# Patient Record
Sex: Male | Born: 1995 | Race: White | Hispanic: No | Marital: Single | Smoking: Never smoker
Health system: Southern US, Community
[De-identification: ages and names within clinical notes are randomized; demographics above are authoritative.]

---
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
## 2013-05-25 ENCOUNTER — Ambulatory Visit (HOSPITAL_BASED_OUTPATIENT_CLINIC_OR_DEPARTMENT_OTHER)
Admission: RE | Admit: 2013-05-25 | Discharge: 2013-05-25 | Disposition: A | Discharge status: Home / Self Care | Payer: BC Managed Care – PPO | Source: Ambulatory Visit | Attending: Family Medicine | Admitting: Family Medicine

## 2013-05-25 ENCOUNTER — Other Ambulatory Visit: Payer: Self-pay | Admitting: Family Medicine

## 2013-05-25 ENCOUNTER — Encounter: Payer: Self-pay | Admitting: Family Medicine

## 2013-05-25 ENCOUNTER — Ambulatory Visit (INDEPENDENT_AMBULATORY_CARE_PROVIDER_SITE_OTHER): Payer: BC Managed Care – PPO | Admitting: Family Medicine

## 2013-05-25 VITALS — BP 104/66 | HR 70 | Temp 99.0°F | Resp 18 | Ht 71.5 in | Wt 154.0 lb

## 2013-05-25 DIAGNOSIS — N50811 Right testicular pain: Secondary | ICD-10-CM

## 2013-05-25 DIAGNOSIS — N509 Disorder of male genital organs, unspecified: Secondary | ICD-10-CM

## 2013-05-25 DIAGNOSIS — N508 Other specified disorders of male genital organs: Secondary | ICD-10-CM | POA: Insufficient documentation

## 2013-05-25 NOTE — Progress Notes (Signed)
Office Note 05/25/2013  CC:  Chief Complaint  Patient presents with  . Establish Care  . Testicle Pain    right sided    HPI:  Luis FriesJeffrey Van Eyk is a 18 y.o. White male who is here to establish care. Patient's most recent primary MD: none Old records were not reviewed prior to or during today's visit.  Discomfort noted in right testicle for approx the last 1 yr.  Minor ache or "squished" feeling. Noting it more frequently lately (x 1 mo--a few times per week at least), used to be just once in a while.   Some adjustment in boxers used to alleviate things ok but not anymore.  He cannot feel or see any bump or anything that is unusual in the area.  Left side feels completely normal. No distinct injury preceding the pain and he does not do any activities that have recurrent trauma to GU region such as biking, horseback riding, or riding tractor.    History reviewed. No pertinent past medical history. Pt reports being appropriately vaccinated--has vaccine records at home.  Past Surgical History  Procedure Laterality Date  . Wisdom tooth extraction  2013    Family History  Problem Relation Age of Onset  . Diabetes Maternal Grandmother     History   Social History  . Marital Status: Single    Spouse Name: N/A    Number of Children: N/A  . Years of Education: N/A   Occupational History  . Not on file.   Social History Main Topics  . Smoking status: Never Smoker   . Smokeless tobacco: Never Used  . Alcohol Use: No  . Drug Use: No  . Sexual Activity: Not on file   Other Topics Concern  . Not on file   Social History Narrative   Single, no children.  Two brothers, three sisters.   [place] from [place].   Tax adviserMechanical engineering internship with [employer]   Currently attending [employer].   No T/A/Ds.         MEDS: none  No Known Allergies  ROS Review of Systems  Constitutional: Negative for fever and fatigue.  HENT: Negative for  congestion and sore throat.   Eyes: Negative for visual disturbance.  Respiratory: Negative for cough.   Cardiovascular: Negative for chest pain.  Gastrointestinal: Negative for nausea and abdominal pain.  Genitourinary: Positive for testicular pain (as per HPI). Negative for dysuria.  Musculoskeletal: Negative for back pain and joint swelling.  Skin: Negative for rash.  Neurological: Negative for weakness and headaches.  Hematological: Negative for adenopathy.  Psychiatric/Behavioral: Negative for dysphoric mood.    PE; Blood pressure 104/66, pulse 70, temperature 99 F (37.2 C), temperature source Temporal, resp. rate 18, height 5' 11.5" (1.816 m), weight 154 lb (69.854 kg), SpO2 98.00%. Gen: Alert, well appearing.  Patient is oriented to person, place, time, and situation. ZOX:WRUEENT:Eyes: no injection, icteris, swelling, or exudate.  EOMI, PERRLA. Mouth: lips without lesion/swelling.  Oral mucosa pink and moist. Oropharynx without erythema, exudate, or swelling.  Neck - No masses or thyromegaly or limitation in range of motion CV: RRR, no m/r/g.   LUNGS: CTA bilat, nonlabored resps, good aeration in all lung fields. ABD: soft, NT, ND, BS normal.  No hepatospenomegaly or mass.  No bruits. EXT: no clubbing, cyanosis, or edema.  Genitals normal; both testes normal but with very mild R>L tenderness over superior-most aspect of epididymus.  No masses, hydroceles, varicoceles, erythema or swelling.  Shaft normal, circumcised, meatus normal without discharge. No inguinal hernia noted. No inguinal lymphadenopathy.  Pertinent labs:  None today  ASSESSMENT AND PLAN:   New pt; no old records to obtain.  Testicular pain, right I think he is a bit more sensitive to touch on superior aspect of epididymus on right compared to left.  However, I don't feel any mass/nodule. Check scrotal u/s since patient in high risk age category for testicular cancer.  An After Visit Summary was printed and given  to the patient.  Return if symptoms worsen or fail to improve.

## 2013-05-25 NOTE — Assessment & Plan Note (Signed)
I think he is a bit more sensitive to touch on superior aspect of epididymus on right compared to left.  However, I don't feel any mass/nodule. Check scrotal u/s since patient in high risk age category for testicular cancer.

## 2013-05-25 NOTE — Progress Notes (Signed)
Pre visit review using our clinic review tool, if applicable. No additional management support is needed unless otherwise documented below in the visit note. 

## 2015-01-04 IMAGING — US US ART/VEN ABD/PELV/SCROTUM DOPPLER LTD
1 series · 14 of 20 positions shown · non-contrast
Comparison: None.

CLINICAL DATA: Right scrotal region pain for 1 year

EXAM:
SCROTAL ULTRASOUND
DOPPLER ULTRASOUND OF THE TESTICLES
TECHNIQUE: Complete ultrasound examination of the testicles, epididymis, and
other scrotal structures was performed. Color and spectral Doppler
ultrasound were also utilized to evaluate blood flow to the
testicles.

[Series 1: us art/ven abd/pelv/scrotum doppler ltd · 0.08mm/px · 14 of 20 slices shown]
[im 1/20]
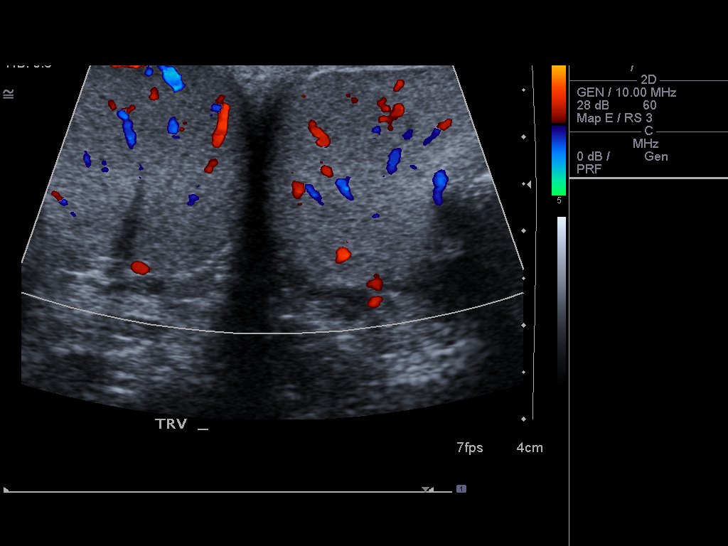
[im 3/20]
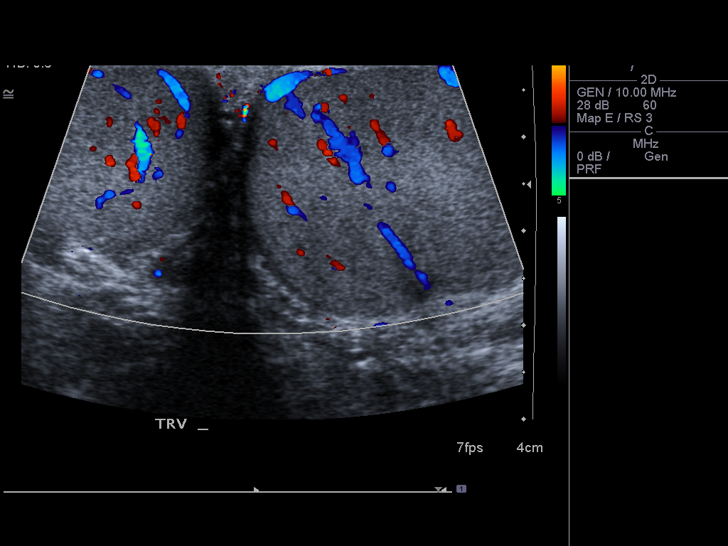
[im 4/20]
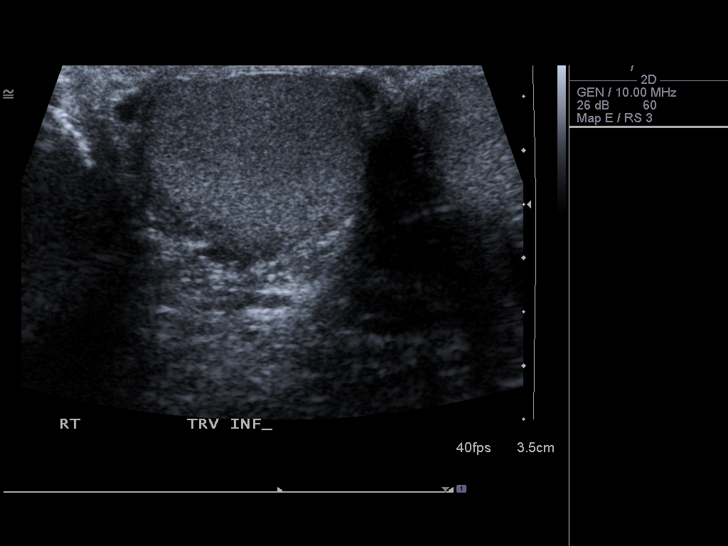
[im 6/20]
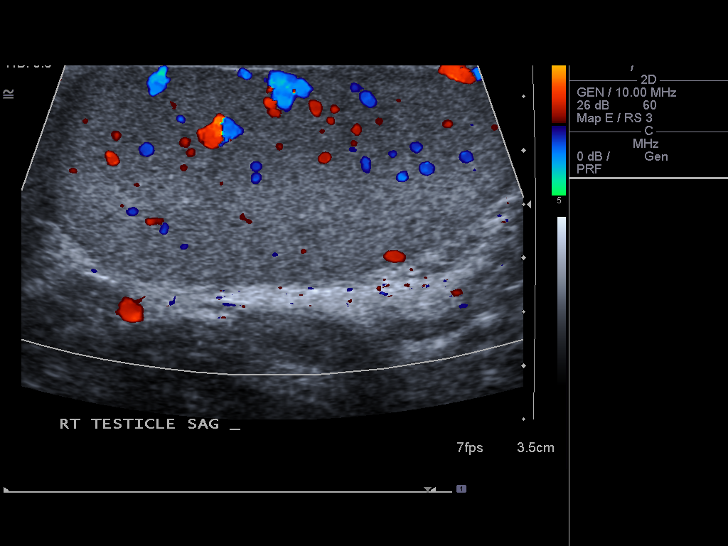
[im 7/20]
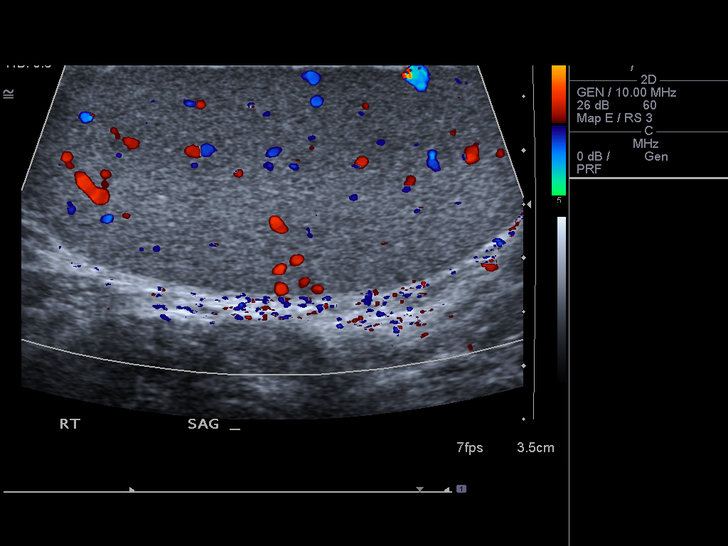
[im 8/20]
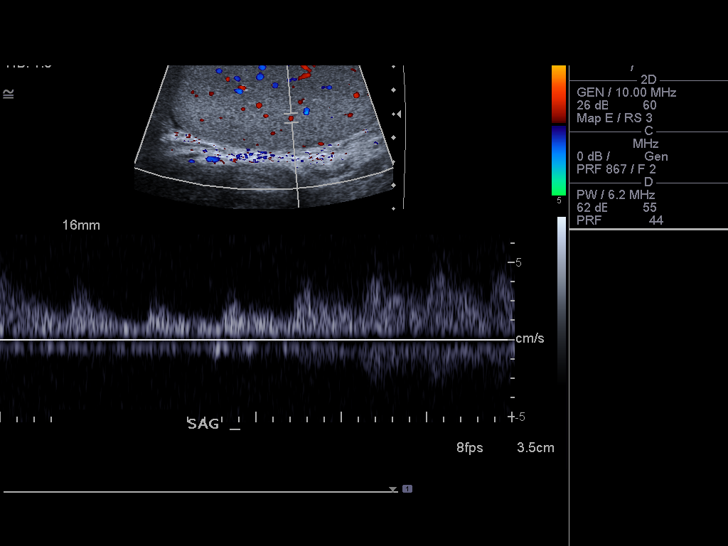
[im 10/20]
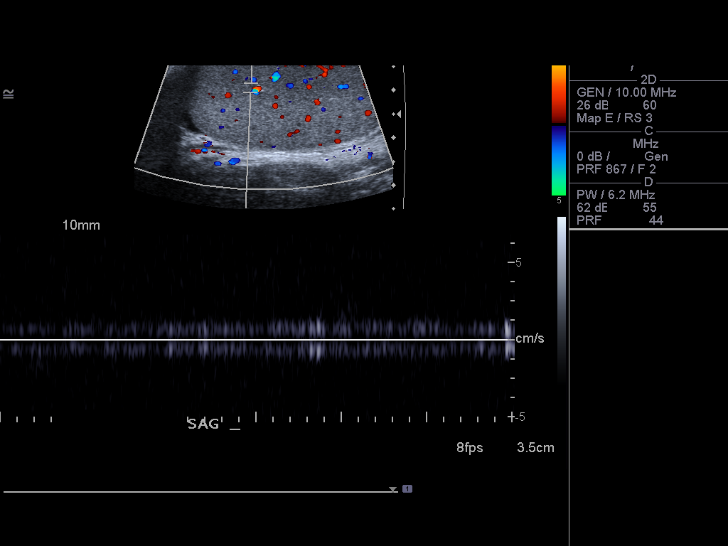
[im 11/20]
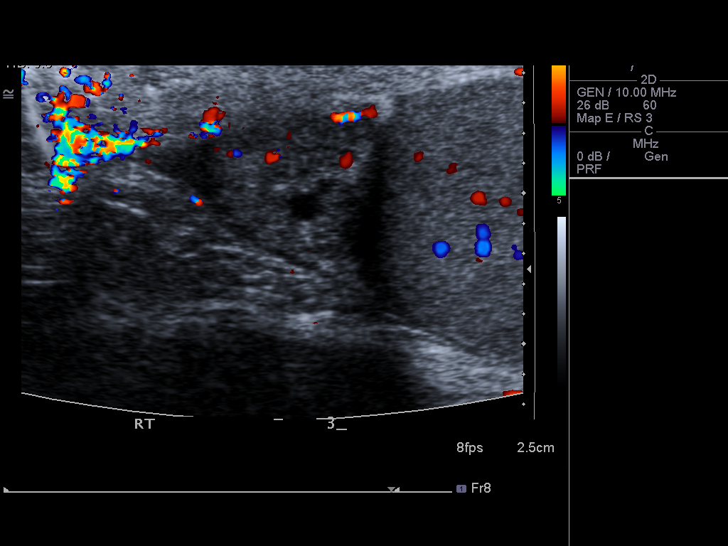
[im 13/20]
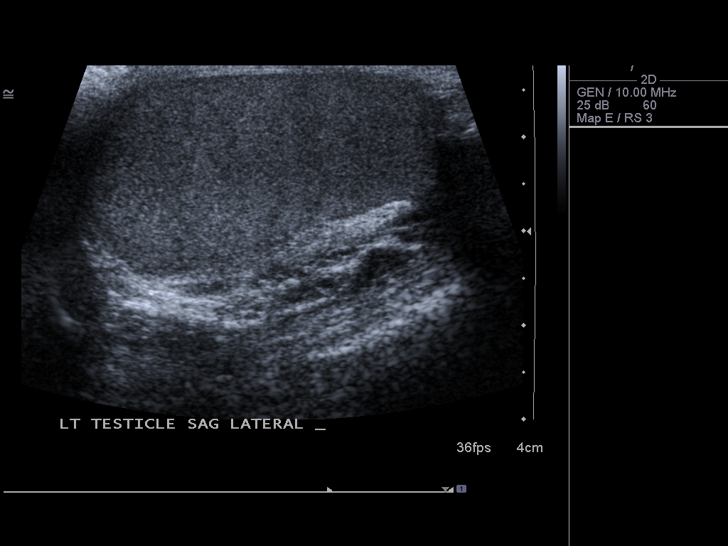
[im 14/20]
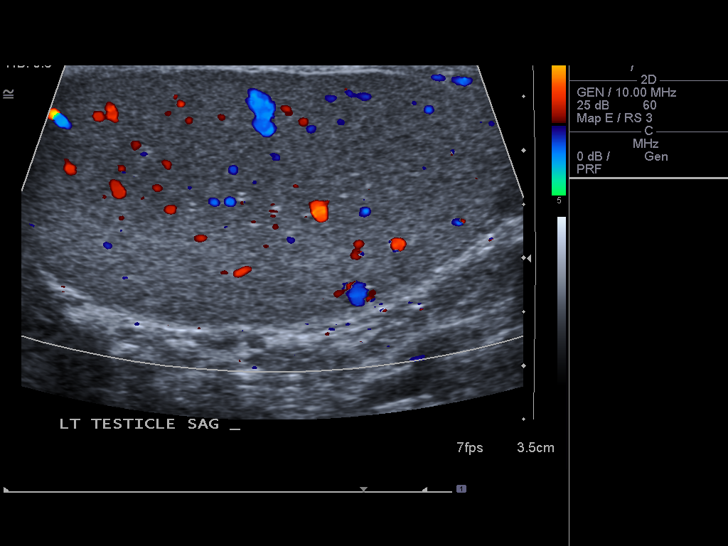
[im 16/20]
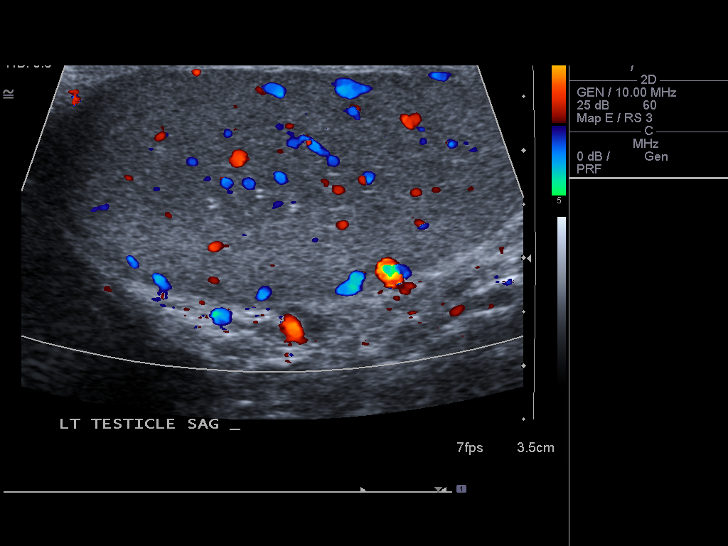
[im 17/20]
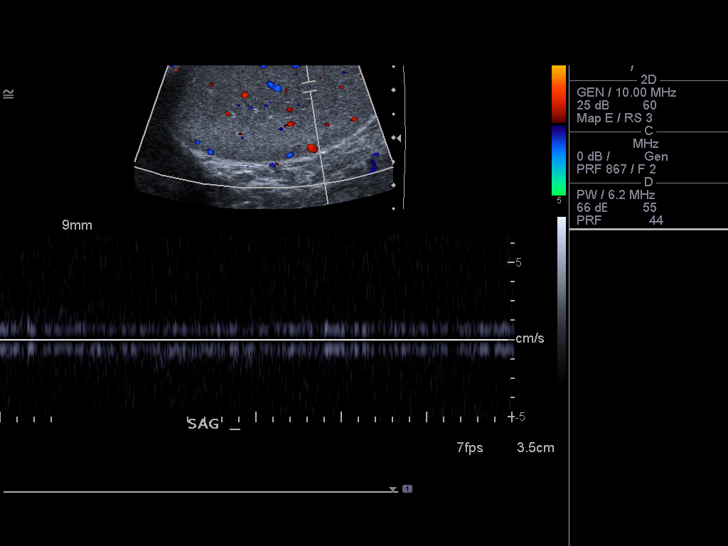
[im 18/20]
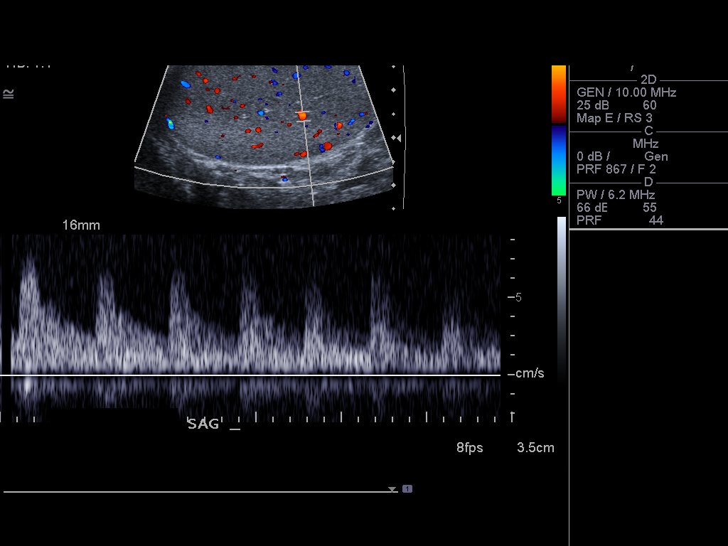
[im 20/20]
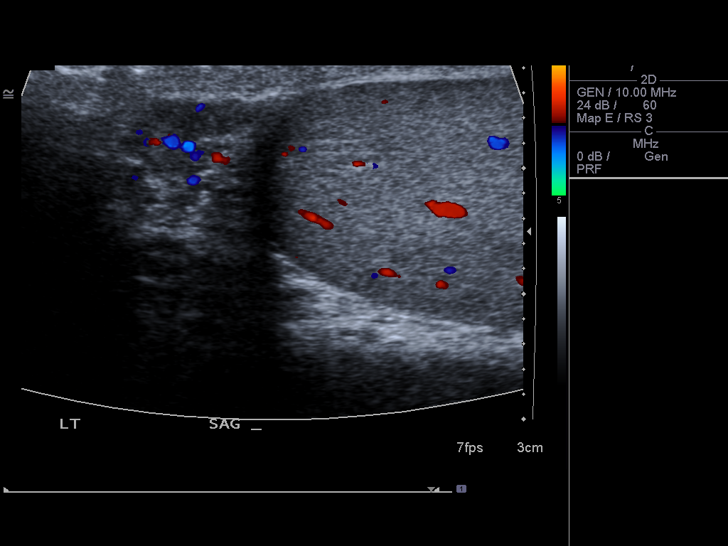

[14 of 20 positions shown; findings below may reference images not displayed]

FINDINGS: Right testicle

Measurements: 4.5 cm x 2.3 cm x 2.7 cm. No mass or microlithiasis
visualized.

Left testicle

Measurements: 4.8 cm x 2.7 cm x 3 cm. No mass or microlithiasis
visualized.

Right epididymis:  3 tiny cysts.  No other abnormality.

Left epididymis:  Normal in size and appearance.

Hydrocele:  None visualized.

Varicocele:  None visualized.

Pulsed Doppler interrogation of both testes demonstrates low
resistance arterial and venous waveforms bilaterally.
IMPRESSION: 1. Normal exam other than 3 tiny right epididymal head cyst.

## 2021-02-03 ENCOUNTER — Ambulatory Visit: Payer: PRIVATE HEALTH INSURANCE | Attending: Student in an Organized Health Care Education/Training Program

## 2021-02-03 DIAGNOSIS — Z87820 Personal history of traumatic brain injury: Secondary | ICD-10-CM

## 2021-02-03 DIAGNOSIS — M940 Chondrocostal junction syndrome [Tietze]: Secondary | ICD-10-CM

## 2021-02-03 DIAGNOSIS — R0789 Other chest pain: Secondary | ICD-10-CM

## 2021-02-03 DIAGNOSIS — Z711 Person with feared health complaint in whom no diagnosis is made: Secondary | ICD-10-CM

## 2021-02-03 DIAGNOSIS — Z0000 Encounter for general adult medical examination without abnormal findings: Secondary | ICD-10-CM

## 2021-02-03 DIAGNOSIS — G8929 Other chronic pain: Secondary | ICD-10-CM

## 2021-02-03 MED ORDER — NAPROXEN 500 MG PO TABS
500 mg | ORAL_TABLET | Freq: Two times a day (BID) | ORAL | 0 refills | Status: AC
Start: 2021-02-03 — End: ?

## 2021-02-03 NOTE — Patient Instructions
Today you saw Dr. Daria Pastures at [facility]; thank you for trusting [place] with your care.    As a result of today's visit, please consider the following:  -it was a pleasure meeting you in the office today   -labs were drawn in the office today   -please start naproxen 500 mg twice daily for your chest discomfort   -we will follow-up regarding your lab results and discuss digestive concerns at our next appointment
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
## 2021-02-04 LAB — Comprehensive Metabolic Panel
ALANINE AMINOTRANSFERASE: 25 U/L (ref 8–70)
ESTIMATED GFR 2021 CKD-EPI: >89 mL/min/{1.73_m2} (ref 8–19)

## 2021-02-04 LAB — Differential Automated: MONOCYTE PERCENT, AUTO: 5.7 (ref 0.00–0.10)

## 2021-02-04 LAB — Hgb A1c: HGB A1C - HPLC: 5.7 — ABNORMAL HIGH (ref <5.7)

## 2021-02-04 LAB — TSH with reflex FT4, FT3: TSH: 1.3 u[IU]/mL (ref 0.3–4.7)

## 2021-02-04 LAB — CBC: RED BLOOD CELL COUNT: 4.74 x10E6/uL (ref 4.41–5.95)

## 2021-02-22 ENCOUNTER — Ambulatory Visit: Payer: PRIVATE HEALTH INSURANCE | Attending: Student in an Organized Health Care Education/Training Program

## 2021-02-22 DIAGNOSIS — M940 Chondrocostal junction syndrome [Tietze]: Secondary | ICD-10-CM

## 2021-02-22 DIAGNOSIS — R079 Chest pain, unspecified: Secondary | ICD-10-CM

## 2021-02-22 DIAGNOSIS — R198 Other specified symptoms and signs involving the digestive system and abdomen: Secondary | ICD-10-CM

## 2021-02-22 DIAGNOSIS — G8929 Other chronic pain: Secondary | ICD-10-CM

## 2021-02-22 NOTE — Progress Notes
PATIENT: treston Munoz  MRN: [id number]  DOB: Jun 12, 1995  DATE OF SERVICE: 02/22/2021    CHIEF COMPLAINT:   Chief Complaint   Patient presents with   ? Follow-up     Review labs and GI problems         HPI   Jerry Munoz is a 26 y.o. male with PMHx has History of concussion on their problem list.. Patient was last seen on 02/03/21; and presents today to discuss the following issue(s):    Healthcare maintenance  -review of labs    Chest discomfort  -did improve with naproxen though still noticeable from time to time    Digestive disease concerns  -reports alternating constipation and diarrhea for many years, states it is in convenience but not a problem for him  -denies fever, chills, nausea, vomiting other symptoms  -endorses quick transit of the GI system based on seen foods he recently ate in his stool      Review of Systems   Per HPI  [x]  10-point ROS reviewed: normal unless stated in HPI    History   family history is not on file.   reports that he has never smoked. He has never used smokeless tobacco.  No past surgical history on file.    MEDS     Current Outpatient Medications   Medication   ? ergocalciferol 1250 mcg (50000 units) capsule   ? naproxen 500 mg tablet       PHYSICAL EXAM     Last Recorded Vital Signs:    02/22/21 1521   BP: 124/74   Pulse: 64   Temp: 36.9 ?C (98.4 ?F)   SpO2: 98%     Body mass index is 25.9 kg/m?Marland Kitchen    GEN: sitting comfortably in NAD  HEENT: conjunctiva clear, MMM, no rhinorrhea  Neck: trachea midline, full ROM  CV: Appears well-perfused  RESP: no tachypnea, symmetric chest rise, breathing easily on RA, speaking in full sentences  MSK: normal muscle bulk, no visible deformities  NEURO: alert, interactive, no gross deficits    LABS/STUDIES   Lab Studies:  Office Visit on 02/03/2021   Component Date Value Ref Range Status   ? TSH 02/03/2021 1.3  0.3 - 4.7 mcIU/mL Final    TSH is normal, no further Thyroid tests were performed.    If applicable TSH pregnancy reference intervals:   First trimester (10-[redacted] weeks gestation): 0.03- 4.0 mcIU/mL  Second trimester (14-[redacted] weeks gestation): 0.19- 4.0 mcIU/mL     ? Hgb A1c - HPLC 02/03/2021 5.7 (H)  <5.7 % Final    For patients with diabetes, an A1c less than (<) or equal (=) to 7.0% is recommended for most patients, however the goal may be higher or lower depending on age and/or other medical problems.   For a diagnosis of diabetes, A1c greater than (>) or equal(=) to 6.5% indicates diabetes; values between 5.7% and 6.4% may indicate an increased risk of developing diabetes.   ? Sodium 02/03/2021 140  135 - 146 mmol/L Final   ? Potassium 02/03/2021 4.1  3.6 - 5.3 mmol/L Final   ? Chloride 02/03/2021 102  96 - 106 mmol/L Final   ? Total CO2 02/03/2021 27  20 - 30 mmol/L Final   ? Anion Gap 02/03/2021 11  8 - 19 mmol/L Final   ? Glucose 02/03/2021 122 (H)  65 - 99 mg/dL Final   ? Creatinine 45/40/9811 1.13  0.60 - 1.30 mg/dL Final   ? Estimated  GFR 02/03/2021 >89  See GFR Additional Information mL/min/1.32m2 Final   ? GFR Additional Information 02/03/2021 See Comment   Final    GFR >89.........Marland KitchenNormal   GFR 60 - 89...Marland KitchenMarland KitchenNormal to mildly decreased   GFR 45 - 59...Marland KitchenMarland KitchenMildly to moderately decreased   GFR 30 - 44...Marland KitchenMarland KitchenModerately to severely decreased   GFR 15 - 29...Marland KitchenMarland KitchenSeverely decreased   GFR <15.........Marland KitchenKidney failure   The 2021 CKD-EPI creatinine equation was used to calculate the estimated GFR and assumes stable creatinine concentrations.   Results are in mL/min/1.73 square meters.  The patient's eGFR MAY need to be adjusted for drug dosing.   For drug dosing, utilize eGFR or eCrCl.   If using the eGFR in very large or very small patients, then multiply the reported eGFR by the estimated BSA and divide by 1.73 m2, in order to obtain eGFR in units of mL/min.   ? Urea Nitrogen 02/03/2021 15  7 - 22 mg/dL Final   ? Calcium 16/10/9602 9.9  8.6 - 10.4 mg/dL Final   ? Total Protein 02/03/2021 7.9  6.1 - 8.2 g/dL Final   ? Albumin 54/09/8117 4.8  3.9 - 5.0 g/dL Final ? Bilirubin,Total 02/03/2021 0.3  0.1 - 1.2 mg/dL Final   ? Alkaline Phosphatase 02/03/2021 89  37 - 113 U/L Final   ? Aspartate Aminotransferase 02/03/2021 26  13 - 62 U/L Final    NOTE: New Reference Range.     ? Alanine Aminotransferase 02/03/2021 25  8 - 70 U/L Final    NOTE: New Reference Range.       ? White Blood Cell Count 02/03/2021 9.38  4.16 - 9.95 x10E3/uL Final   ? Red Blood Cell Count 02/03/2021 4.74  4.41 - 5.95 x10E6/uL Final   ? Hemoglobin 02/03/2021 14.5  13.5 - 17.1 g/dL Final   ? Hematocrit 14/78/2956 44.2  38.5 - 52.0 % Final   ? Mean Corpuscular Volume 02/03/2021 93.2  79.3 - 98.6 fL Final   ? Mean Corpuscular Hemoglobin 02/03/2021 30.6  26.4 - 33.4 pg Final   ? MCH Concentration 02/03/2021 32.8  31.5 - 35.5 g/dL Final   ? Red Cell Distribution Width-SD 02/03/2021 44.4  36.9 - 48.3 fL Final   ? Red Cell Distribution Width-CV 02/03/2021 13.1  11.1 - 15.5 % Final   ? Platelet Count, Auto 02/03/2021 336  143 - 398 x10E3/uL Final   ? Mean Platelet Volume 02/03/2021 10.8  9.3 - 13.0 fL Final   ? Nucleated RBC%, automated 02/03/2021 0.0  No Ref. Range % Final    Percent Reference Range Not Reported per accrediting agency   ? Absolute Nucleated RBC Count 02/03/2021 0.00  0.00 - 0.00 x10E3/uL Final   ? Neutrophil Abs (Prelim) 02/03/2021 6.49  See Absolute Neut Ct. x10E3/uL Final    This is a preliminary result.  If automated differential see Absolute Neut  Count or if manual differential see Absolute Neut Ct, Manual for final result.   ? Neutrophil Percent, Auto 02/03/2021 69.2  No Ref. Range % Final    Percent reference range not reported per accrediting agency.     ? Lymphocyte Percent, Auto 02/03/2021 23.2  No Ref. Range % Final    Percent reference range not reported per accrediting agency.     ? Monocyte Percent, Auto 02/03/2021 5.7  No Ref. Range % Final    Percent reference range not reported per accrediting agency.     ? Eosinophil Percent, Auto 02/03/2021 1.3  No Ref. Range %  Final Percent reference range not reported per accrediting agency.     ? Basophil Percent, Auto 02/03/2021 0.4  No Ref. Range % Final    Percent reference range not reported per accrediting agency.     ? Immature Granulocytes% 02/03/2021 0.2  No Reference Range % Final    Percent reference range not reported per accrediting agency.     ? Absolute Neut Count 02/03/2021 6.49  1.80 - 6.90 x10E3/uL Final   ? Absolute Lymphocyte Count 02/03/2021 2.18  1.30 - 3.40 x10E3/uL Final   ? Absolute Mono Count 02/03/2021 0.53  0.20 - 0.80 x10E3/uL Final   ? Absolute Eos Count 02/03/2021 0.12  0.00 - 0.50 x10E3/uL Final   ? Absolute Baso Count 02/03/2021 0.04  0.00 - 0.10 x10E3/uL Final   ? Absolute Immature Gran Count 02/03/2021 0.02  0.00 - 0.04 x10E3/uL Final       Imaging Studies:   No imaging has been resulted in the last 30 days      I have:   []  Reviewed/ordered []  1 []  2 [x]  ? 3 unique laboratory, radiology, and/or diagnostic tests noted below    []  Reviewed []  1 []  2 [x]  ? 3 prior external notes and incorporated into patient assessment    []  Discussed management or test interpretation with external provider(s) as noted      Assessment & Plan   Jerry Munoz is a 26 y.o. male presenting for   Chief Complaint   Patient presents with   ? Follow-up     Review labs and GI problems    :    1. Alternating constipation and diarrhea    2. Chronic chest pain    3. Costochondritis    4. History of concussion      Alternating constipation and diarrhea   -consider IBS with no obvious infection or indication of IBD.  Reviewed labs with no indication electrolyte imbalance, infection, inflammation, organ dysfunction, or otherwise  -patient will trial Metamucil daily for bowel regularity  -if not improved, will pursue further workup  -long discussion with patient that chest discomfort and recent changes in sleep may all be part of underlying anxiety, which GI symptoms may also be a part  -offered psychologist contact information which patient declined at this time.  History of concussions may be affecting mood as well  -continue him at future visits      The above recommendation were discussed with the patient.  The patient has all questions answered satisfactorily and is in agreement with this recommended plan of care.      Author: Daria Pastures, MD    02/22/2021 4:05 PM  Family Medicine with Obstetrics  Georgeann Oppenheim  Clinical Instructor, [facility]  [address]  [place], [place] [address]  Office: [phone]  Fax: [phone]    Please note portions of this note have been composed using FluencyDirect voice dictation software; please excuse any inaccuracies.
